# Patient Record
Sex: Male | Born: 1975 | Race: White | Hispanic: No | Marital: Single | Smoking: Never smoker
Health system: Southern US, Community
[De-identification: ages and names within clinical notes are randomized; demographics above are authoritative.]

---

## 1998-03-07 ENCOUNTER — Emergency Department (HOSPITAL_COMMUNITY): Admission: EM | Admit: 1998-03-07 | Discharge: 1998-03-07 | Payer: Self-pay | Admitting: Internal Medicine

## 1998-03-08 ENCOUNTER — Emergency Department (HOSPITAL_COMMUNITY): Admission: EM | Admit: 1998-03-08 | Discharge: 1998-03-08 | Payer: Self-pay

## 1998-03-10 ENCOUNTER — Encounter: Admission: RE | Admit: 1998-03-10 | Discharge: 1998-06-08 | Payer: Self-pay | Admitting: Internal Medicine

## 1998-07-18 ENCOUNTER — Emergency Department (HOSPITAL_COMMUNITY): Admission: EM | Admit: 1998-07-18 | Discharge: 1998-07-18 | Payer: Self-pay | Admitting: Emergency Medicine

## 1998-07-20 ENCOUNTER — Emergency Department (HOSPITAL_COMMUNITY): Admission: EM | Admit: 1998-07-20 | Discharge: 1998-07-20 | Payer: Self-pay | Admitting: Emergency Medicine

## 2015-06-10 ENCOUNTER — Encounter (HOSPITAL_BASED_OUTPATIENT_CLINIC_OR_DEPARTMENT_OTHER): Payer: Self-pay | Admitting: *Deleted

## 2015-06-10 ENCOUNTER — Emergency Department (HOSPITAL_BASED_OUTPATIENT_CLINIC_OR_DEPARTMENT_OTHER)
Admission: EM | Admit: 2015-06-10 | Discharge: 2015-06-10 | Disposition: A | Discharge status: Home / Self Care | Payer: Federal, State, Local not specified - PPO | Attending: Emergency Medicine | Admitting: Emergency Medicine

## 2015-06-10 ENCOUNTER — Emergency Department (HOSPITAL_BASED_OUTPATIENT_CLINIC_OR_DEPARTMENT_OTHER): Payer: Federal, State, Local not specified - PPO

## 2015-06-10 DIAGNOSIS — M25531 Pain in right wrist: Secondary | ICD-10-CM | POA: Diagnosis not present

## 2015-06-10 MED ORDER — HYDROCODONE-ACETAMINOPHEN 5-325 MG PO TABS: 1.0000 | ORAL_TABLET | ORAL | Status: DC | PRN

## 2015-06-10 MED ORDER — NAPROXEN 500 MG PO TABS: 500.0000 mg | ORAL_TABLET | Freq: Two times a day (BID) | ORAL | Status: AC

## 2015-06-10 NOTE — ED Provider Notes (Signed)
CSN: [id number]     Arrival date & time 06/10/15  2205 History  By signing my name below, I, John Rosario, attest that this documentation has been prepared under the direction and in the presence of John ScottMartha Linker, MD. Electronically Signed: Budd PalmerVanessa Rosario, ED Scribe. 06/10/2015. 10:32 PM.    Chief Complaint  Patient presents with  . Hand Injury   The history is provided by the patient. No language interpreter was used.   HPI Comments: John MeadMarc Thomas Rosario is a 39 y.o. male who presents to the Emergency Department complaining of a painful injury to the left hand sustained 6.5 hours ago. Pt states he was breaking branches over a wheelbarrow when he bore down on one and felt a sharp pain in his hand. He notes the pain resolved after a few minutes. He then went on to do other work without any pain. He states the pain then slowly resumed while gripping a power drill about 1 hour ago. He notes that within 5-10 minutes the pain worsened dramatically. He reports associated swelling to the hand and notes exacerbation of the pain with movement. He states he has taken 800 mg ibuprofen without relief about an hour ago.    History reviewed. No pertinent past medical history. History reviewed. No pertinent past surgical history. No family history on file. Social History  Substance Use Topics  . Smoking status: Never Smoker   . Smokeless tobacco: None  . Alcohol Use: No    Review of Systems  Musculoskeletal: Positive for myalgias, joint swelling and arthralgias.  All other systems reviewed and are negative.   Allergies  Review of patient's allergies indicates no known allergies.  Home Medications   Prior to Admission medications   Medication Sig Start Date End Date Taking? Authorizing Provider  HYDROcodone-acetaminophen (NORCO/VICODIN) 5-325 MG tablet Take 1 tablet by mouth every 4 (four) hours as needed. 06/10/15   John ScottMartha Linker, MD  naproxen (NAPROSYN) 500 MG tablet Take 1 tablet (500 mg  total) by mouth 2 (two) times daily. 06/10/15   John ScottMartha Linker, MD   BP 128/79 mmHg  Pulse 76  Temp(Src) 98.3 F (36.8 C) (Oral)  Resp 20  Ht 5\' 11"  (1.803 m)  Wt 171 lb (77.565 kg)  BMI 23.86 kg/m2  SpO2 100%  Vitals reviewed Physical Exam  Physical Examination: General appearance - alert, well appearing, and in no distress Mental status - alert, oriented to person, place, and time Eyes - no conjunctival injection, no scleral icterus Chest - clear to auscultation, no wheezes, rales or rhonchi, symmetric air entry Neurological - alert, oriented, normal speech, sensation intact in right hand- medial/ulnar/radial distribution, pt able to flex and extend fingers although with some pain of thumb with flexion and extension Musculoskeletal - ttp over palmar surface of wrist on radial aspect, 2+radial pulse, otherwise no joint tenderness, deformity or swelling Extremities - peripheral pulses normal, no pedal edema, no clubbing or cyanosis Skin - normal coloration and turgor, no rashes  ED Course  Procedures  DIAGNOSTIC STUDIES: Oxygen Saturation is 100% on RA, normal by my interpretation.    COORDINATION OF CARE: 10:30 PM - Discussed possible fracture or tendonitis. Discussed plans to order diagnostic imaging. Pt advised of plan for treatment and pt agrees.  Labs Review Labs Reviewed - No data to display  Imaging Review Dg Wrist Complete Right  06/10/2015  CLINICAL DATA:  Sudden onset of sharp RIGHT wrist pain breaking sticks of wood, this evening having sharp pain and loss of grip  RIGHT hand EXAM: RIGHT WRIST - COMPLETE 3+ VIEW COMPARISON:  None FINDINGS: Osseous mineralization normal for technique. Question mild dorsal subluxation of ulna at distal radioulnar joint versus projectional artifact, seen only on lateral view. Bone island in distal radial metaphysis. No acute fracture, dislocation, or bone destruction. IMPRESSION: No acute fracture dislocation. On lateral view question of  mild dorsal subluxation of ulna at distal radioulnar joint versus projectional artifact. Electronically Signed   By: Ulyses Southward M.D.   On: 06/10/2015 23:12   I have personally reviewed and evaluated these images and lab results as part of my medical decision-making.   EKG Interpretation None      MDM   Final diagnoses:  Wrist pain, acute, right   Pt presenting with c/o right wrist pain.  Xray does not show any acute abnormality- ? Subluxation of dorsal ulna- no ttp over ulnar aspect, pain is overlying palmar aspect of radial wrist.  Pt placed in wrist splint, given referral for followup with Dr. Melvyn Novas, hand surgery.  Discharged with strict return precautions.  Pt agreeable with plan.  I personally performed the services described in this documentation, which was scribed in my presence. The recorded information has been reviewed and is accurate.    John Scott, MD 06/10/15 [phone]

## 2015-06-10 NOTE — Discharge Instructions (Signed)
Return to the ED with any concerns including increased pain, numbness/discoloration/swelling of fingers or hand, or any other alarming symptoms

## 2015-06-10 NOTE — ED Notes (Signed)
This afternoon he was gripping sticks of wood and breaking it over a wheel barrow. He had a sudden onset of sharp pain in his hand but was able to continue to work throughout the day. Tonight he had sharp pain and lose of grip in his hand.

## 2017-01-28 IMAGING — DX DG WRIST COMPLETE 3+V*R*
4 series · 4 of 4 positions shown · non-contrast
Comparison: None

CLINICAL DATA: Sudden onset of sharp RIGHT wrist pain breaking
sticks of [place], this evening having sharp pain and loss of grip
RIGHT hand

EXAM:
RIGHT WRIST - COMPLETE 3+ VIEW

[wrist pa]
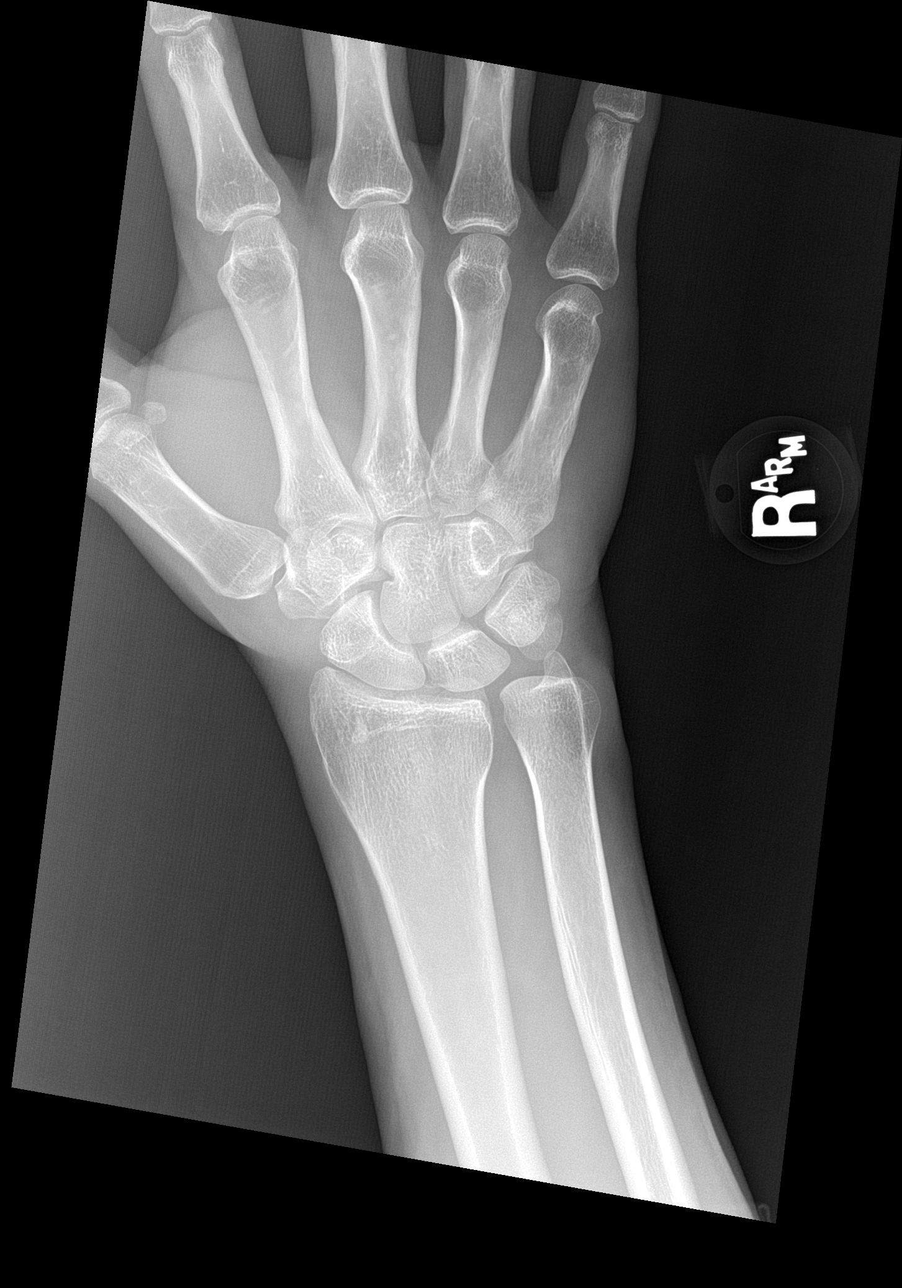

[wrist obl]
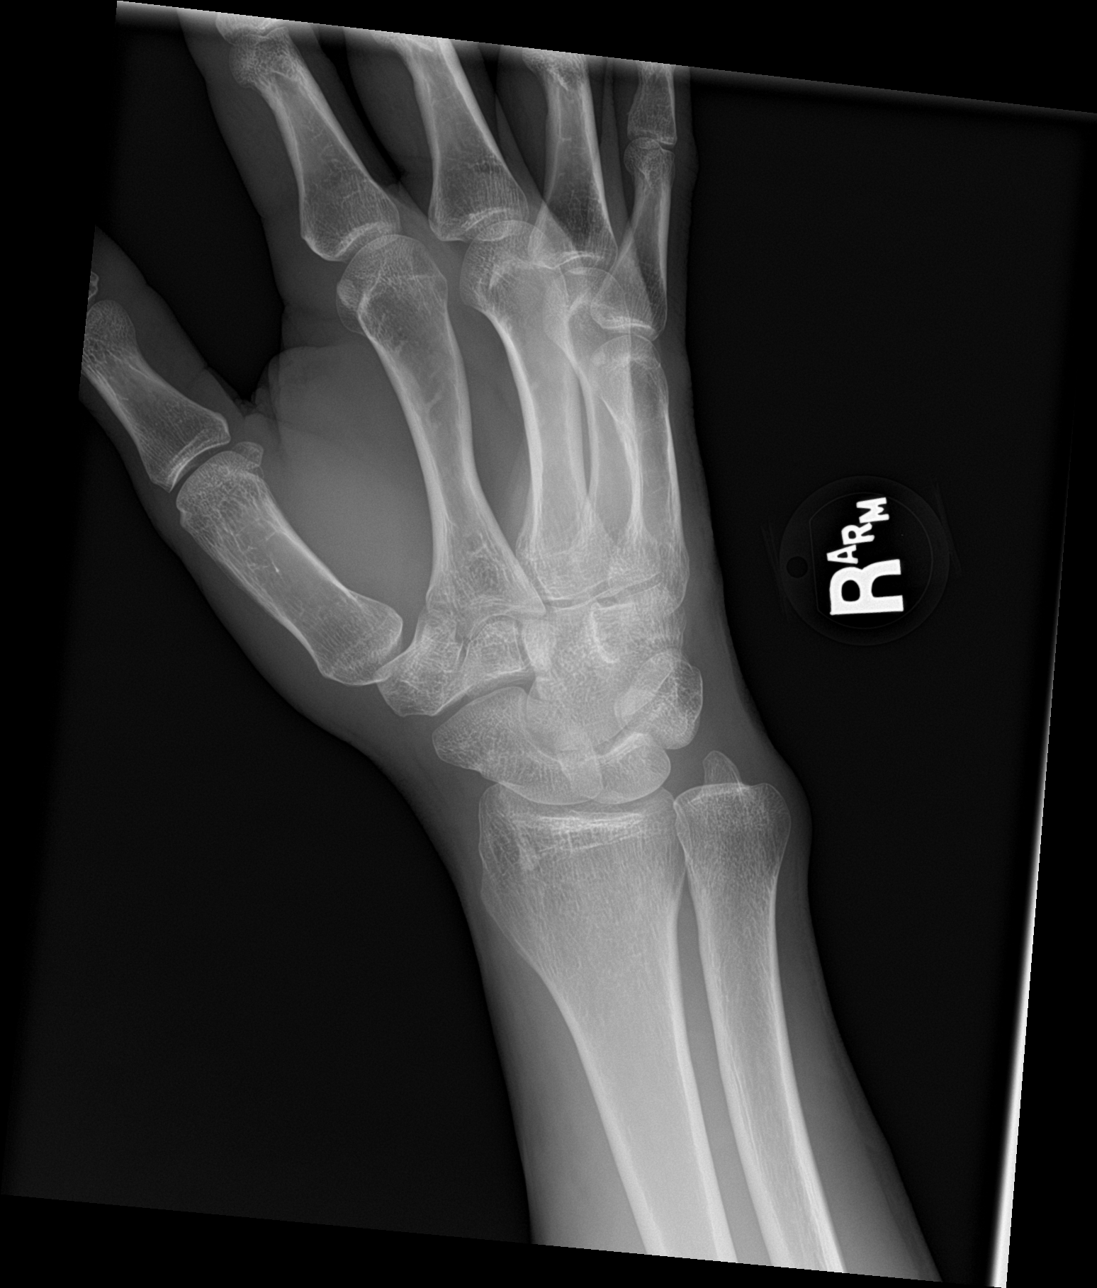

[wrist lat]
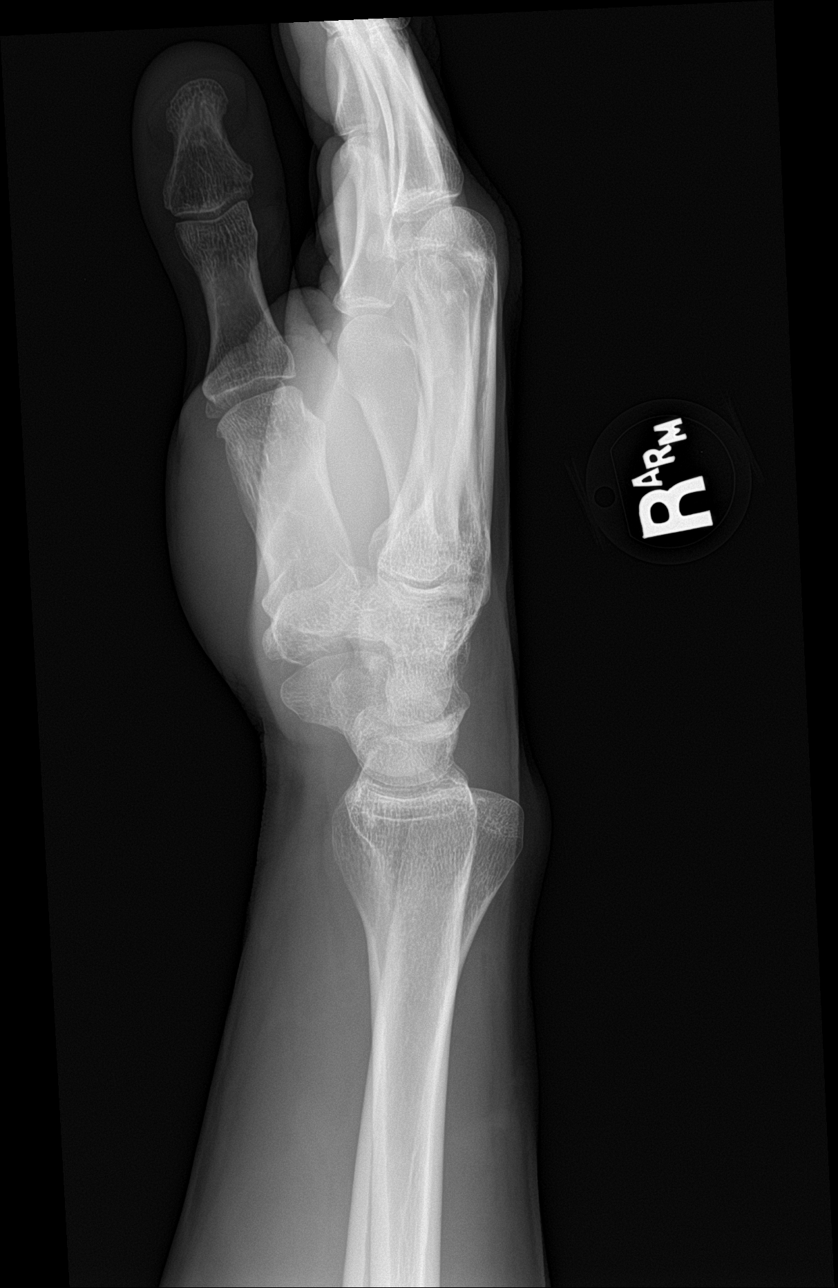

[wrist navicular]
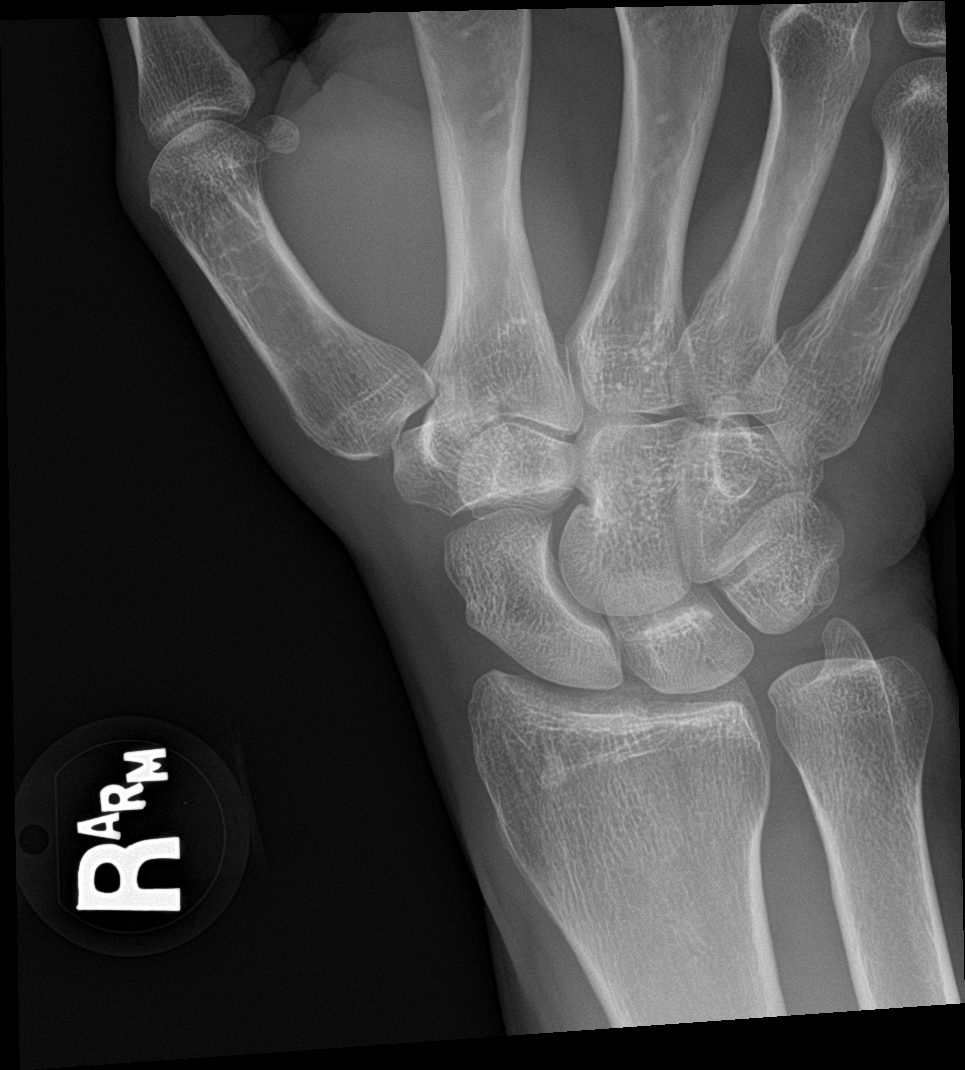

[4 of 4 positions shown; findings below may reference images not displayed]

FINDINGS: Osseous mineralization normal for technique.

Question mild dorsal subluxation of ulna at distal radioulnar joint
versus projectional artifact, seen only on lateral view.

Bone island in distal radial metaphysis.

No acute fracture, dislocation, or bone destruction.
IMPRESSION: No acute fracture dislocation.

On lateral view question of mild dorsal subluxation of ulna at
distal radioulnar joint versus projectional artifact.

## 2021-12-21 ENCOUNTER — Encounter (HOSPITAL_BASED_OUTPATIENT_CLINIC_OR_DEPARTMENT_OTHER): Payer: Self-pay | Admitting: Urology

## 2021-12-21 ENCOUNTER — Other Ambulatory Visit: Payer: Self-pay

## 2021-12-21 ENCOUNTER — Emergency Department (HOSPITAL_BASED_OUTPATIENT_CLINIC_OR_DEPARTMENT_OTHER)
Admission: EM | Admit: 2021-12-21 | Discharge: 2021-12-21 | Disposition: A | Discharge status: Home / Self Care | Payer: Federal, State, Local not specified - PPO | Attending: Emergency Medicine | Admitting: Emergency Medicine

## 2021-12-21 ENCOUNTER — Emergency Department (HOSPITAL_BASED_OUTPATIENT_CLINIC_OR_DEPARTMENT_OTHER): Payer: Federal, State, Local not specified - PPO

## 2021-12-21 DIAGNOSIS — M25521 Pain in right elbow: Secondary | ICD-10-CM | POA: Diagnosis not present

## 2021-12-21 DIAGNOSIS — S52024A Nondisplaced fracture of olecranon process without intraarticular extension of right ulna, initial encounter for closed fracture: Secondary | ICD-10-CM | POA: Insufficient documentation

## 2021-12-21 DIAGNOSIS — S59901A Unspecified injury of right elbow, initial encounter: Secondary | ICD-10-CM | POA: Diagnosis present

## 2021-12-21 DIAGNOSIS — W19XXXA Unspecified fall, initial encounter: Secondary | ICD-10-CM | POA: Insufficient documentation

## 2021-12-21 DIAGNOSIS — S52021A Displaced fracture of olecranon process without intraarticular extension of right ulna, initial encounter for closed fracture: Secondary | ICD-10-CM

## 2021-12-21 DIAGNOSIS — Y9365 Activity, lacrosse and field hockey: Secondary | ICD-10-CM | POA: Diagnosis not present

## 2021-12-21 MED ORDER — ACETAMINOPHEN 325 MG PO TABS
650.0000 mg | ORAL_TABLET | Freq: Once | ORAL | Status: AC
  Administered 2021-12-21: 650 mg via ORAL
  Filled 2021-12-21: qty 2

## 2021-12-21 MED ORDER — OXYCODONE HCL 5 MG PO TABS: 5.0000 mg | ORAL_TABLET | ORAL | 0 refills | Status: AC | PRN

## 2021-12-21 MED ORDER — IBUPROFEN 800 MG PO TABS
800.0000 mg | ORAL_TABLET | Freq: Once | ORAL | Status: AC
  Administered 2021-12-21: 800 mg via ORAL
  Filled 2021-12-21: qty 1

## 2021-12-21 MED ORDER — IBUPROFEN 600 MG PO TABS: 600.0000 mg | ORAL_TABLET | Freq: Four times a day (QID) | ORAL | 0 refills | Status: AC | PRN

## 2021-12-21 MED ORDER — ACETAMINOPHEN 325 MG PO TABS: 650.0000 mg | ORAL_TABLET | Freq: Four times a day (QID) | ORAL | 0 refills | Status: AC | PRN

## 2021-12-21 NOTE — ED Provider Notes (Signed)
[facility] EMERGENCY DEPARTMENT Provider Note   CSN: [id number] Arrival date & time: 12/21/21  2101     History  Chief Complaint  Patient presents with   Elbow Injury    John Rosario is a 46 y.o. male.  Patient as above with significant medical history as below, including ulnar nerve irritation who presents to the ED with complaint of right elbow injury.  Right-hand-dominant male, last intake was around 4 PM today.  Patient reports he was playing hockey, fell onto his right elbow.  Immediate pain to the right elbow.  Difficulty moving his arm.  Difficulty flexing or extending his forearm.  Paresthesias to his third fourth and fifth digits.  No LOC, no thinners, no other injuries reported.  No prior surgery to this extremity but does report prior ulnar nerve issues for which she was recently taking prednisone for.     History reviewed. No pertinent past medical history.  History reviewed. No pertinent surgical history.   The history is provided by the patient. No language interpreter was used.       Home Medications Prior to Admission medications   Medication Sig Start Date End Date Taking? Authorizing Provider  acetaminophen (TYLENOL) 325 MG tablet Take 2 tablets (650 mg total) by mouth every 6 (six) hours as needed. 12/21/21  Yes Tanda Rockers A, DO  ibuprofen (ADVIL) 600 MG tablet Take 1 tablet (600 mg total) by mouth every 6 (six) hours as needed. 12/21/21  Yes Tanda Rockers A, DO  oxyCODONE (ROXICODONE) 5 MG immediate release tablet Take 1 tablet (5 mg total) by mouth every 4 (four) hours as needed for severe pain. 12/21/21  Yes Tanda Rockers A, DO  naproxen (NAPROSYN) 500 MG tablet Take 1 tablet (500 mg total) by mouth 2 (two) times daily. 06/10/15   Mabe, Latanya Maudlin, MD      Allergies    Patient has no known allergies.    Review of Systems   Review of Systems  Constitutional:  Negative for chills and fever.  HENT:  Negative for facial swelling and trouble  swallowing.   Eyes:  Negative for photophobia and visual disturbance.  Respiratory:  Negative for cough and shortness of breath.   Cardiovascular:  Negative for chest pain and palpitations.  Gastrointestinal:  Negative for abdominal pain, nausea and vomiting.  Endocrine: Negative for polydipsia and polyuria.  Genitourinary:  Negative for difficulty urinating and hematuria.  Musculoskeletal:  Positive for arthralgias. Negative for gait problem and joint swelling.  Skin:  Negative for pallor and rash.  Neurological:  Negative for syncope and headaches.       Paresthesias right hand  Psychiatric/Behavioral:  Negative for agitation and confusion.     Physical Exam Updated Vital Signs BP 130/85   Pulse 61   Temp 98.2 F (36.8 C) (Oral)   Resp 18   Ht 5\' 11"  (1.803 m)   Wt 77.6 kg   SpO2 99%   BMI 23.85 kg/m  Physical Exam Vitals and nursing note reviewed.  Constitutional:      General: He is not in acute distress.    Appearance: He is well-developed.  HENT:     Head: Normocephalic and atraumatic. No raccoon eyes, Battle's sign, right periorbital erythema or left periorbital erythema.     Comments: No external evidence of head trauma    Right Ear: External ear normal.     Left Ear: External ear normal.     Mouth/Throat:     Mouth:  Mucous membranes are moist.  Eyes:     General: No scleral icterus. Cardiovascular:     Rate and Rhythm: Normal rate and regular rhythm.     Pulses: Normal pulses.     Heart sounds: Normal heart sounds.  Pulmonary:     Effort: Pulmonary effort is normal. No respiratory distress.     Breath sounds: Normal breath sounds.  Abdominal:     General: Abdomen is flat.     Palpations: Abdomen is soft.     Tenderness: There is no abdominal tenderness.  Musculoskeletal:        General: Normal range of motion.       Arms:     Cervical back: Normal range of motion.     Right lower leg: No edema.     Left lower leg: No edema.     Comments: TTP  posterior.  Radial/ ulnar pulses palpable bilateral.  Symmetric.  Paresthesias to third fourth and fifth digit palmar aspect.  Sensorimotor function intact to radial, ulnar median nerve distributions.  Reduced grip strength right  Unable to extend forearm, he can flex his forearm a few degrees but is exquisitely painful.  Edema to right elbow.  No obvious laceration or wound.  Skin:    General: Skin is warm and dry.     Capillary Refill: Capillary refill takes less than 2 seconds.  Neurological:     Mental Status: He is alert and oriented to person, place, and time.     GCS: GCS eye subscore is 4. GCS verbal subscore is 5. GCS motor subscore is 6.  Psychiatric:        Mood and Affect: Mood normal.        Behavior: Behavior normal.     ED Results / Procedures / Treatments   Labs (all labs ordered are listed, but only abnormal results are displayed) Labs Reviewed - No data to display  EKG None  Radiology DG Elbow Complete Right  Result Date: 12/21/2021 CLINICAL DATA:  Fall playing hockey.  Right elbow pain, swelling EXAM: RIGHT ELBOW - COMPLETE 3+ VIEW COMPARISON:  None FINDINGS: There is a fracture through the olecranon process which is displaced approximately 2 cm. No subluxation or dislocation. Associated joint effusion. Posterior soft tissue swelling. IMPRESSION: Displaced olecranon process fracture. Electronically Signed   By: Charlett Nose M.D.   On: 12/21/2021 21:30    Procedures .Ortho Injury Treatment  Date/Time: 12/22/2021 11:55 PM  Performed by: Sloan Leiter, DO Authorized by: Sloan Leiter, DO   Consent:    Consent obtained:  Verbal   Consent given by:  Patient   Risks discussed:  Fracture   Alternatives discussed:  Alternative treatment, no treatment and immobilizationInjury location: elbow Location details: right elbow Injury type: fracture Fracture type: olecranon process Pre-procedure neurovascular assessment: neurovascularly intact Pre-procedure  distal perfusion: normal Pre-procedure neurological function: normal Pre-procedure range of motion: reduced  Anesthesia: Local anesthesia used: no  Patient sedated: NoManipulation performed: no Immobilization: splint and sling Splint type: long arm Splint Applied by: ED Tech Supplies used: Ortho-Glass Post-procedure neurovascular assessment: post-procedure neurovascularly intact Post-procedure distal perfusion: normal Post-procedure neurological function: normal Post-procedure range of motion: unchanged       Medications Ordered in ED Medications  ibuprofen (ADVIL) tablet 800 mg (800 mg Oral Given 12/21/21 2239)  acetaminophen (TYLENOL) tablet 650 mg (650 mg Oral Given 12/21/21 2238)    ED Course/ Medical Decision Making/ A&P Clinical Course as of 12/22/21 2358  Thu Dec 21, 2021  2155 Spoke with Dr Pleas Koch ortho regarding pt, he has reviewed imaging. Recommend posterior splint and follow up in the office for o/p surgery next week.  [SG]    Clinical Course User Index [SG] Sloan Leiter, DO                           Medical Decision Making Amount and/or Complexity of Data Reviewed Radiology: ordered.  Risk OTC drugs. Prescription drug management.    CC: Elbow injury  This patient presents to the Emergency Department for the above complaint. This involves an extensive number of treatment options and is a complaint that carries with it a high risk of complications and morbidity. Vital signs were reviewed. Serious etiologies considered.  DDx includes not limited to sprain, strain, fracture, MSK, soft tissue, vascular injury, nerve injury, other acute etiologies were considered  Record review:  Previous records obtained and reviewed  Prior office visits, care primarily through [place].  PDMP was reviewed  Additional history obtained from N/A  Medical and surgical history as noted above.   Work up as above, notable for:  Labs & imaging results that were available  during my care of the patient were visualized by me and considered in my medical decision making.   I ordered imaging studies which included elbow x-ray right. I visualized the imaging, interpreted images, and I agree with radiologist interpretation.  Displaced olecranon fracture  Personally discussed patient care with consultant; orthopedics  Management: Analgesics, splint  ED Course: Clinical Course as of 12/22/21 2358  Thu Dec 21, 2021  2155 Spoke with Dr Pleas Koch ortho regarding pt, he has reviewed imaging. Recommend posterior splint and follow up in the office for o/p surgery next week.  [SG]    Clinical Course User Index [SG] Sloan Leiter, DO     Reassessment:  Pain mildly improved after being placed in splint.  Patient does not want any narcotic medications as he was driving home.  We will send prescription for oxycodone to his pharmacy.  Admission was considered.   Patient with olecranon fracture on imaging following traumatic injury.  Discussed with orthopedics, Dr. Ophelia Charter who has reviewed imaging, recommend splinting and outpatient follow-up next week for outpatient surgery.  Patient is agreeable.  Referral placed.  Discharge home with analgesics, strict return precautions.  The patient improved significantly and was discharged in stable condition. Detailed discussions were had with the patient regarding current findings, and need for close f/u with PCP or on call doctor. The patient has been instructed to return immediately if the symptoms worsen in any way for re-evaluation. Patient verbalized understanding and is in agreement with current care plan. All questions answered prior to discharge.              Social determinants of health include -  Social History   Socioeconomic History   Marital status: Single    Spouse name: Not on file   Number of children: Not on file   Years of education: Not on file   Highest education level: Not on file  Occupational  History   Not on file  Tobacco Use   Smoking status: Never   Smokeless tobacco: Not on file  Substance and Sexual Activity   Alcohol use: No   Drug use: No   Sexual activity: Not on file  Other Topics Concern   Not on file  Social History Narrative   Not on file  Social Determinants of Health   Financial Resource Strain: Not on file  Food Insecurity: Not on file  Transportation Needs: Not on file  Physical Activity: Not on file  Stress: Not on file  Social Connections: Not on file  Intimate Partner Violence: Not on file      This chart was dictated using voice recognition software.  Despite best efforts to proofread,  errors can occur which can change the documentation meaning.         Final Clinical Impression(s) / ED Diagnoses Final diagnoses:  Closed fracture of olecranon process of right ulna, initial encounter    Rx / DC Orders ED Discharge Orders          Ordered    oxyCODONE (ROXICODONE) 5 MG immediate release tablet  Every 4 hours PRN        12/21/21 2240    acetaminophen (TYLENOL) 325 MG tablet  Every 6 hours PRN        12/21/21 2240    ibuprofen (ADVIL) 600 MG tablet  Every 6 hours PRN        12/21/21 2240    Ambulatory referral to Orthopedic Surgery        12/21/21 2252              Sloan Leiter, DO 12/22/21 2358

## 2021-12-21 NOTE — ED Triage Notes (Signed)
Approx 40 min PTA, fell playing hockey and landed on right elbow, unable to extend/flex Extensive swelling noted  Numbness down to hand and unable to grip

## 2021-12-21 NOTE — Discharge Instructions (Addendum)
It was a pleasure caring for you today in the emergency department.   Please call orthopedic office tomorrow for appointment.  Please return to the emergency dept for any worsening or worrisome symptoms.

## 2021-12-25 ENCOUNTER — Ambulatory Visit
Admission: RE | Admit: 2021-12-25 | Discharge: 2021-12-25 | Disposition: A | Discharge status: Home / Self Care | Payer: Federal, State, Local not specified - PPO | Source: Ambulatory Visit | Attending: Orthopedic Surgery | Admitting: Orthopedic Surgery

## 2021-12-25 ENCOUNTER — Other Ambulatory Visit: Payer: Self-pay | Admitting: Orthopedic Surgery

## 2021-12-25 DIAGNOSIS — S52023A Displaced fracture of olecranon process without intraarticular extension of unspecified ulna, initial encounter for closed fracture: Secondary | ICD-10-CM

## 2021-12-27 ENCOUNTER — Other Ambulatory Visit: Payer: Self-pay | Admitting: Orthopaedic Surgery
